# Patient Record
Sex: Male | Born: 1995 | Race: White | Hispanic: No | Marital: Single | Smoking: Current some day smoker
Health system: Southern US, Community
[De-identification: ages and names within clinical notes are randomized; demographics above are authoritative.]

## PROBLEM LIST (undated history)

## (undated) HISTORY — PX: EYE SURGERY: SHX253

---
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
## 2016-02-01 ENCOUNTER — Encounter (HOSPITAL_COMMUNITY): Payer: Self-pay | Admitting: Emergency Medicine

## 2016-02-01 ENCOUNTER — Emergency Department (HOSPITAL_COMMUNITY)
Admission: EM | Admit: 2016-02-01 | Discharge: 2016-02-01 | Disposition: A | Discharge status: Home / Self Care | Payer: Managed Care, Other (non HMO) | Attending: Emergency Medicine | Admitting: Emergency Medicine

## 2016-02-01 DIAGNOSIS — R410 Disorientation, unspecified: Secondary | ICD-10-CM | POA: Diagnosis present

## 2016-02-01 DIAGNOSIS — F1290 Cannabis use, unspecified, uncomplicated: Secondary | ICD-10-CM | POA: Diagnosis not present

## 2016-02-01 DIAGNOSIS — Z7289 Other problems related to lifestyle: Secondary | ICD-10-CM

## 2016-02-01 DIAGNOSIS — F1010 Alcohol abuse, uncomplicated: Secondary | ICD-10-CM | POA: Insufficient documentation

## 2016-02-01 DIAGNOSIS — F17290 Nicotine dependence, other tobacco product, uncomplicated: Secondary | ICD-10-CM | POA: Insufficient documentation

## 2016-02-01 DIAGNOSIS — F1310 Sedative, hypnotic or anxiolytic abuse, uncomplicated: Secondary | ICD-10-CM | POA: Diagnosis not present

## 2016-02-01 DIAGNOSIS — F1090 Alcohol use, unspecified, uncomplicated: Secondary | ICD-10-CM

## 2016-02-01 LAB — URINALYSIS, ROUTINE W REFLEX MICROSCOPIC
BILIRUBIN URINE: NEGATIVE
GLUCOSE, UA: NEGATIVE mg/dL
Hgb urine dipstick: NEGATIVE
KETONES UR: NEGATIVE mg/dL
LEUKOCYTES UA: NEGATIVE
NITRITE: NEGATIVE
PROTEIN: NEGATIVE mg/dL
Specific Gravity, Urine: 1.026 (ref 1.005–1.030)
pH: 6.5 (ref 5.0–8.0)

## 2016-02-01 LAB — CBC
HCT: 44.4 % (ref 39.0–52.0)
Hemoglobin: 14.6 g/dL (ref 13.0–17.0)
MCH: 29.9 pg (ref 26.0–34.0)
MCHC: 32.9 g/dL (ref 30.0–36.0)
MCV: 91 fL (ref 78.0–100.0)
PLATELETS: 270 10*3/uL (ref 150–400)
RBC: 4.88 MIL/uL (ref 4.22–5.81)
RDW: 13.4 % (ref 11.5–15.5)
WBC: 9.3 10*3/uL (ref 4.0–10.5)

## 2016-02-01 LAB — COMPREHENSIVE METABOLIC PANEL
ALK PHOS: 84 U/L (ref 38–126)
ALT: 28 U/L (ref 17–63)
AST: 24 U/L (ref 15–41)
Albumin: 4.6 g/dL (ref 3.5–5.0)
Anion gap: 3 — ABNORMAL LOW (ref 5–15)
BILIRUBIN TOTAL: 0.4 mg/dL (ref 0.3–1.2)
BUN: 9 mg/dL (ref 6–20)
CALCIUM: 9.3 mg/dL (ref 8.9–10.3)
CO2: 28 mmol/L (ref 22–32)
CREATININE: 0.66 mg/dL (ref 0.61–1.24)
Chloride: 112 mmol/L — ABNORMAL HIGH (ref 101–111)
GFR calc Af Amer: >60 mL/min (ref >60)
GLUCOSE: 93 mg/dL (ref 65–99)
Potassium: 4.1 mmol/L (ref 3.5–5.1)
Sodium: 143 mmol/L (ref 135–145)
TOTAL PROTEIN: 7.3 g/dL (ref 6.5–8.1)

## 2016-02-01 LAB — LIPASE, BLOOD: Lipase: 20 U/L (ref 11–51)

## 2016-02-01 NOTE — ED Triage Notes (Signed)
Patient reports taking a xanax yesterday evening "for recreational use". Reports LUQ abdominal pain, N/V, confusion, visual changes. Denies fever, diarrhea. A&O x4.
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
## 2016-02-01 NOTE — Discharge Instructions (Signed)
Substance Abuse Treatment Programs ° °Intensive Outpatient Programs °[facility]     °[address]      °[place], [place]                   °[phone]      ° °[facility] °[address] °[place], [place] °[phone] ° °[facility]     °(Inpatient and outpatient)     °[address]           °[phone]   ° °[facility] °[phone] (Suboxone and Methadone) ° °[address]      °[place], [place] [address]      °336-882-2125      ° °[address] °[place], [place] °852-3033 ° °[facility] (Outpatient/Inpatient, Chemical)    °(insurance only) [phone]      °       °[facility] (Groups & Residential) °[place], [place] °336-389-1413 ° °   °[facility]     °[address]     °[place], [place]      °[phone]      ° °[facility] (for caregivers and family) °[address] °[place], [place] °336-299-4655 ° ° ° ° ° °Residential Treatment Programs °[facility]      °[address], [place], [place] [address]  °([phone]      ° °[facility] °[address] [place], [place] [address] °[phone] ° °[facility]        °[phone]      ° °[facility] °[phone] ° °[facility]             °[address]                                         °[place], [place]                                                °[phone] or 336-784-9470                              ° °[facility] [place] °[address] °[place], [address] °[phone] ° °[facility]    °[address]      °[place], [place]     °[phone]      ° °[facility] °[address] °, [place] °336-285-9073 ° °[facility]   °[address]     °[place], [place] [address]     °[phone]      °Admissions: 8am-3pm M-F ° °[facility] °[address] °[place] °336-227-7417 ° °[facility]: Residential Program (90 Days)   °[place], [place]      °336-725-8389 or 800-758-6077    ° °[facility] °[place], [place] °(Walk in Hours over the weekend or by referral) ° °[facility] °[address], [place], [place] [address] °([phone] ° °Crisis Mobile: [facility]:  1-877-626-1772 (for crisis response 24 hours a day) °[facility] Hotline:      1-800-256-2452 °Outpatient Psychiatry and Counseling ° °[facility]: Mobile Crisis   Management 24 hours:  1-877-626-1772 ° °[facility] sliding scale fee and walk in schedule: M-F 8am-12pm/1pm-3pm °[address]  °[place], [place] [address] °[phone] ° °[facility] °[address] °[place], [place] [address] °336-703-9650 ° °[facility] (Formerly known [facility])- new patient walk-in appointments available Monday - Friday 8am -3pm.          °[address] °[place], [place] [address] °336-676-6840 or crisis line- 336-676-6905 ° °[facility]/ Intensive Outpatient Therapy Program °[address] °[place], [place] [address] °336-832-9804 ° °[facility]                  °Crisis Services      °[phone]      °[address]     °[place], [place] [address]                ° °[facility]   °[facility] °[phone] °[address] °[place], [place] [address] ° ° °[facility]          °[address],  °[place], [place] [address]       °([phone] ° °[facility] °[address] °[place], [place] [address] °336-292-1510 ° °[facility]    °[address]    °[place], [place] [address]     °336-632-3505      ° °Parish McKinney, MD     °[address]     °[place] [address]     °336-282-1251     °  °[facility] °[address] °[place] [address] ° °[facility]     °[address]     °[place], [place]      °336-542-2076      ° °[facility] °Shamsher Ahluwalia, MD °[address] °[place], [place] [address] °[phone] ° °[facility]     °[address]     °[place], [place] [address]     °336-274-1237      ° °[facility] °[address] °[place], [place] [address] °336-854-4450 °Resources for Temporary Residential Assistance/Crisis Centers ° °DAY CENTERS °[facility] °M-F 8am-3pm   °[address] [place], [place] [address]   336-332-0824 °Services include: laundry, barbering, support groups, case management, phone  & computer access, showers, AA/NA mtgs, mental health/substance abuse nurse, job skills class, disability information, VA assistance, spiritual classes, etc.  ° °HOMELESS SHELTERS ° °[facility]     °[facility]   °[address], [place]     °336.271.5959       °       °[facility] (women and children)       °[address] °[place], [place] [address] °336-275-0820 °[email or web address] for application and process °Application Required ° °[facility]   °[address]    °[place] [address]     °336.886.4922       °             °[facility] °[address] °, [place] [address] °336.273.5572 °336-235-0363(schedule application appt.) °Application Required ° °[facility] (women only)    °[address]     °[place],  [address]     °336-884-1039      °  Intake starts 6pm daily °Need valid ID, SSC, & Police report °[facility] [place] °[address] °[place], [place] °336-881-5420 °Application Required ° °[facility] (men only)     °[address]      °[place], [place]     °[phone]      ° °[facility] °(Pregnant women only) °[address] °, [place] °336-275-0206 ° °[facility]      °[address]      °[place], [place] [address]     °336-722-9951      °       °[facility] °[address] °[place], [place] °336-723-1848 °90 day commitment/SA/Application process ° °[facility](men only)     °[address]     °[place], [place]     °336-748-1962       °Check-in at 7pm     °       °[facility] °[address] °[place], [place] [address] °336-248-6684 °Men/Women/Women and Children must be there by 7 pm ° °[facility] °[place],  °336-722-8721                ° °
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
